# Patient Record
Sex: Male | Born: 1980 | Hispanic: Yes | Marital: Married | Smoking: Current every day smoker
Health system: Southern US, Community
[De-identification: ages and names within clinical notes are randomized; demographics above are authoritative.]

---

## 2007-03-31 ENCOUNTER — Inpatient Hospital Stay: Payer: Self-pay | Admitting: Internal Medicine

## 2011-03-10 ENCOUNTER — Emergency Department: Payer: Self-pay | Admitting: Unknown Physician Specialty

## 2013-03-10 ENCOUNTER — Emergency Department: Payer: Self-pay | Admitting: Emergency Medicine

## 2016-09-20 ENCOUNTER — Emergency Department
Admission: EM | Admit: 2016-09-20 | Discharge: 2016-09-20 | Disposition: A | Discharge status: Home / Self Care | Payer: BLUE CROSS/BLUE SHIELD | Attending: Emergency Medicine | Admitting: Emergency Medicine

## 2016-09-20 ENCOUNTER — Encounter: Payer: Self-pay | Admitting: Emergency Medicine

## 2016-09-20 DIAGNOSIS — F17200 Nicotine dependence, unspecified, uncomplicated: Secondary | ICD-10-CM | POA: Insufficient documentation

## 2016-09-20 DIAGNOSIS — K029 Dental caries, unspecified: Secondary | ICD-10-CM | POA: Insufficient documentation

## 2016-09-20 DIAGNOSIS — K0889 Other specified disorders of teeth and supporting structures: Secondary | ICD-10-CM | POA: Diagnosis present

## 2016-09-20 MED ORDER — TRAMADOL HCL 50 MG PO TABS: 50.0000 mg | ORAL_TABLET | Freq: Four times a day (QID) | ORAL | 0 refills | Status: DC | PRN

## 2016-09-20 MED ORDER — MAGIC MOUTHWASH
5.0000 mL | Freq: Once | ORAL | Status: AC
  Administered 2016-09-20: 5 mL via ORAL
  Filled 2016-09-20: qty 10

## 2016-09-20 MED ORDER — IBUPROFEN 600 MG PO TABS
600.0000 mg | ORAL_TABLET | Freq: Once | ORAL | Status: AC
  Administered 2016-09-20: 600 mg via ORAL
  Filled 2016-09-20: qty 1

## 2016-09-20 MED ORDER — TRAMADOL HCL 50 MG PO TABS
50.0000 mg | ORAL_TABLET | Freq: Once | ORAL | Status: AC
  Administered 2016-09-20: 50 mg via ORAL
  Filled 2016-09-20: qty 1

## 2016-09-20 MED ORDER — CEPHALEXIN 500 MG PO CAPS
500.0000 mg | ORAL_CAPSULE | Freq: Once | ORAL | Status: AC
  Administered 2016-09-20: 500 mg via ORAL
  Filled 2016-09-20: qty 1

## 2016-09-20 MED ORDER — CEPHALEXIN 500 MG PO CAPS: 500.0000 mg | ORAL_CAPSULE | Freq: Four times a day (QID) | ORAL | 0 refills | Status: AC

## 2016-09-20 NOTE — ED Provider Notes (Signed)
[facility] Emergency Department Provider Note   ____________________________________________   First MD Initiated Contact with Patient 09/20/16 0304     (approximate)  I have reviewed the triage vital signs and the nursing notes.   HISTORY  Chief Complaint Dental Pain    HPI Darrell Wang is a 35 y.o. male who comes into the hospital today with some left-sided tooth pain. The patient reports it started 4 days ago but got really bad tonight. The patient has been taking Tylenol but has not helped. The patient does not have a dentist that he has not gone to see his dentist. The patient has numerous cavities. He denies any facial swelling or fever. The patient rates his pain a 10 out of 10 in intensity currently. He is here for evaluation and treatment of the symptoms.   History reviewed. No pertinent past medical history.  There are no active problems to display for this patient.   History reviewed. No pertinent surgical history.  Prior to Admission medications   Medication Sig Start Date End Date Taking? Authorizing Provider  cephALEXin (KEFLEX) 500 MG capsule Take 1 capsule (500 mg total) by mouth 4 (four) times daily. 09/20/16 09/30/16  Rebecka ApleyAllison P Suesan Mohrmann, MD  traMADol (ULTRAM) 50 MG tablet Take 1 tablet (50 mg total) by mouth every 6 (six) hours as needed. 09/20/16   Rebecka ApleyAllison P Chane Cowden, MD    Allergies Patient has no known allergies.  No family history on file.  Social History Social History  Substance Use Topics  . Smoking status: Current Every Day Smoker  . Smokeless tobacco: Not on file  . Alcohol use No    Review of Systems Constitutional: No fever/chills Eyes: No visual changes. ENT: Dental pain Cardiovascular: Denies chest pain. Respiratory: Denies shortness of breath. Gastrointestinal: No abdominal pain.  No nausea, no vomiting.  No diarrhea.  No constipation. Genitourinary: Negative for dysuria. Musculoskeletal: Negative for  back pain. Skin: Negative for rash. Neurological: Negative for headaches, focal weakness or numbness.  10-point ROS otherwise negative.  ____________________________________________   PHYSICAL EXAM:  VITAL SIGNS: ED Triage Vitals  Enc Vitals Group     BP 09/20/16 0145 (!) 164/102     Pulse Rate 09/20/16 0145 72     Resp 09/20/16 0145 18     Temp 09/20/16 0145 98.3 F (36.8 C)     Temp Source 09/20/16 0145 Oral     SpO2 09/20/16 0145 99 %     Weight 09/20/16 0144 190 lb (86.2 kg)     Height 09/20/16 0144 5\' 3"  (1.6 m)     Head Circumference --      Peak Flow --      Pain Score 09/20/16 0143 10     Pain Loc --      Pain Edu? --      Excl. in GC? --     Constitutional: Alert and oriented. Well appearing and in Moderate distress. Eyes: Conjunctivae are normal. PERRL. EOMI. Head: Atraumatic. Nose: No congestion/rhinnorhea. Mouth/Throat: Mucous membranes are moist.  Oropharynx non-erythematous. Poor dentition with numerous dental caries. Pain to the left first mandibular molar Neck: No stridor.   Cardiovascular: Normal rate, regular rhythm. Grossly normal heart sounds.  Good peripheral circulation. Respiratory: Normal respiratory effort.  No retractions. Lungs CTAB. Gastrointestinal: Soft and nontender. No distention. Positive bowel sounds Musculoskeletal: No lower extremity tenderness nor edema.   Neurologic:  Normal speech and language.  Skin:  Skin is warm, dry and intact. Marland Kitchen. Psychiatric:  Mood and affect are normal.   ____________________________________________   LABS (all labs ordered are listed, but only abnormal results are displayed)  Labs Reviewed - No data to display ____________________________________________  EKG  none ____________________________________________  RADIOLOGY  none ____________________________________________   PROCEDURES  Procedure(s) performed: None  Procedures  Critical Care performed:  No  ____________________________________________   INITIAL IMPRESSION / ASSESSMENT AND PLAN / ED COURSE  Pertinent labs & imaging results that were available during my care of the patient were reviewed by me and considered in my medical decision making (see chart for details).  This is a 35 year old male who comes into the hospital today with some dental pain. The patient has poor dentition. I will give him some tramadol and ibuprofen as well as a dose of Keflex. I will encourage the patient to follow-up. He has no swelling to his face nor any difficulty breathing. He should follow-up with a dentist.  The patient stated he had some continued pains I will also give him some Magic mouthwash. He will be discharged home.  Clinical Course      ____________________________________________   FINAL CLINICAL IMPRESSION(S) / ED DIAGNOSES  Final diagnoses:  Dental caries  Pain, dental      NEW MEDICATIONS STARTED DURING THIS VISIT:  Discharge Medication List as of 09/20/2016  3:45 AM    START taking these medications   Details  cephALEXin (KEFLEX) 500 MG capsule Take 1 capsule (500 mg total) by mouth 4 (four) times daily., Starting Wed 09/20/2016, Until Sat 09/30/2016, Print    traMADol (ULTRAM) 50 MG tablet Take 1 tablet (50 mg total) by mouth every 6 (six) hours as needed., Starting Wed 09/20/2016, Print         Note:  This document was prepared using Dragon voice recognition software and may include unintentional dictation errors.    Rebecka ApleyAllison P Latifah Padin, MD 09/20/16 [phone]

## 2016-09-20 NOTE — ED Triage Notes (Signed)
Patient ambulatory to triage with steady gait, without difficulty or distress noted; pt reports left lower dental pain x 4 days

## 2019-04-03 ENCOUNTER — Other Ambulatory Visit: Payer: Self-pay

## 2019-04-03 ENCOUNTER — Emergency Department: Payer: Worker's Compensation

## 2019-04-03 ENCOUNTER — Emergency Department
Admission: EM | Admit: 2019-04-03 | Discharge: 2019-04-03 | Disposition: A | Discharge status: Home / Self Care | Payer: Worker's Compensation | Attending: Emergency Medicine | Admitting: Emergency Medicine

## 2019-04-03 ENCOUNTER — Encounter: Payer: Self-pay | Admitting: Emergency Medicine

## 2019-04-03 DIAGNOSIS — Y990 Civilian activity done for income or pay: Secondary | ICD-10-CM | POA: Diagnosis not present

## 2019-04-03 DIAGNOSIS — Z23 Encounter for immunization: Secondary | ICD-10-CM | POA: Diagnosis not present

## 2019-04-03 DIAGNOSIS — S91332A Puncture wound without foreign body, left foot, initial encounter: Secondary | ICD-10-CM | POA: Diagnosis not present

## 2019-04-03 DIAGNOSIS — Y9301 Activity, walking, marching and hiking: Secondary | ICD-10-CM | POA: Insufficient documentation

## 2019-04-03 DIAGNOSIS — W450XXA Nail entering through skin, initial encounter: Secondary | ICD-10-CM | POA: Insufficient documentation

## 2019-04-03 DIAGNOSIS — F17210 Nicotine dependence, cigarettes, uncomplicated: Secondary | ICD-10-CM | POA: Insufficient documentation

## 2019-04-03 DIAGNOSIS — Y929 Unspecified place or not applicable: Secondary | ICD-10-CM | POA: Diagnosis not present

## 2019-04-03 DIAGNOSIS — S99922A Unspecified injury of left foot, initial encounter: Secondary | ICD-10-CM | POA: Diagnosis present

## 2019-04-03 MED ORDER — IBUPROFEN 600 MG PO TABS: 600.0000 mg | ORAL_TABLET | Freq: Three times a day (TID) | ORAL | 0 refills | Status: AC | PRN

## 2019-04-03 MED ORDER — IBUPROFEN 600 MG PO TABS
600.0000 mg | ORAL_TABLET | Freq: Once | ORAL | Status: AC
  Administered 2019-04-03: 15:00:00 600 mg via ORAL
  Filled 2019-04-03: qty 1

## 2019-04-03 MED ORDER — TETANUS-DIPHTH-ACELL PERTUSSIS 5-2.5-18.5 LF-MCG/0.5 IM SUSP
0.5000 mL | Freq: Once | INTRAMUSCULAR | Status: AC
  Administered 2019-04-03: 0.5 mL via INTRAMUSCULAR
  Filled 2019-04-03: qty 0.5

## 2019-04-03 MED ORDER — CEPHALEXIN 500 MG PO CAPS: 500.0000 mg | ORAL_CAPSULE | Freq: Three times a day (TID) | ORAL | 0 refills | Status: AC

## 2019-04-03 NOTE — ED Provider Notes (Signed)
[facility] Emergency Department Provider Note  ____________________________________________   First MD Initiated Contact with Patient 04/03/19 1417     (approximate)  I have reviewed the triage vital signs and the nursing notes.   HISTORY  Chief Complaint Foot Pain   HPI Darrell Wang is a 38 y.o. male presents to the ED with puncture wound to the plantar aspect of his left foot.  Patient states this occurred at work when he stepped on a nail and it went through his work shoe.  Patient also is unaware of the last time he had a tetanus shot but states "it is been a long time".  Currently rates his pain as a 10/10.     History reviewed. No pertinent past medical history.  There are no active problems to display for this patient.   History reviewed. No pertinent surgical history.  Prior to Admission medications   Medication Sig Start Date End Date Taking? Authorizing Provider  cephALEXin (KEFLEX) 500 MG capsule Take 1 capsule (500 mg total) by mouth 3 (three) times daily. 04/03/19   Tommi Rumps, PA-C  ibuprofen (ADVIL) 600 MG tablet Take 1 tablet (600 mg total) by mouth every 8 (eight) hours as needed. 04/03/19   Tommi Rumps, PA-C    Allergies Patient has no known allergies.  No family history on file.  Social History Social History   Tobacco Use   Smoking status: Current Every Day Smoker    Packs/day: 0.50    Types: Cigarettes   Smokeless tobacco: Never Used  Substance Use Topics   Alcohol use: No   Drug use: Not on file    Review of Systems Constitutional: No fever/chills Cardiovascular: Denies chest pain. Respiratory: Denies shortness of breath. Musculoskeletal: Positive for left foot pain. Skin: Positive for puncture wound. Neurological: Negative for headaches, focal weakness or numbness. ____________________________________________   PHYSICAL EXAM:  VITAL SIGNS: ED Triage Vitals  Enc Vitals Group     BP  04/03/19 1343 140/86     Pulse Rate 04/03/19 1343 87     Resp 04/03/19 1343 16     Temp 04/03/19 1343 98.1 F (36.7 C)     Temp Source 04/03/19 1343 Oral     SpO2 04/03/19 1343 97 %     Weight 04/03/19 1335 200 lb (90.7 kg)     Height 04/03/19 1335 5\' 3"  (1.6 m)     Head Circumference --      Peak Flow --      Pain Score 04/03/19 1334 10     Pain Loc --      Pain Edu? --      Excl. in GC? --    Constitutional: Alert and oriented. Well appearing and in no acute distress. Eyes: Conjunctivae are normal.  Head: Atraumatic. Neck: No stridor.   Cardiovascular: Normal rate, regular rhythm. Grossly normal heart sounds.  Good peripheral circulation. Respiratory: Normal respiratory effort.  No retractions. Lungs CTAB. Musculoskeletal: On examination of the left foot there is no gross deformity however on plantar aspect there is 1 single puncture wound to the lateral aspect of his foot without active bleeding and no foreign body was noted.  Patient is able to move digits distally without any difficulty and motor sensory function intact. Neurologic:  Normal speech and language. No gross focal neurologic deficits are appreciated.  Skin:  Skin is warm, dry and intact.  Single puncture wound as noted above. Psychiatric: Mood and affect are normal. Speech and  behavior are normal.  ____________________________________________   LABS (all labs ordered are listed, but only abnormal results are displayed)  Labs Reviewed - No data to display ____________________________________________  RADIOLOGY  Official radiology report(s): Dg Foot Complete Left  Result Date: 04/03/2019 CLINICAL DATA:  38 year old male stepped on a nail at work. Puncture wound to bottom of foot. EXAM: LEFT FOOT - COMPLETE 3+ VIEW COMPARISON:  None. FINDINGS: No radiopaque foreign body identified. No soft tissue gas. No discrete soft tissue injury. Bone mineralization is within normal limits. There is no evidence of fracture or  dislocation. There is no evidence of arthropathy or other focal bone abnormality. IMPRESSION: No osseous abnormality identified.  No radiopaque foreign body. Electronically Signed   By: Odessa FlemingH  Hall M.D.   On: 04/03/2019 15:13    ____________________________________________   PROCEDURES  Procedure(s) performed (including Critical Care):  Procedures   ____________________________________________   INITIAL IMPRESSION / ASSESSMENT AND PLAN / ED COURSE  As part of my medical decision making, I reviewed the following data within the electronic MEDICAL RECORD NUMBER Notes from prior ED visits and [place] Controlled Substance Database   38 year old male presents to the ED with a puncture wound to his left foot that occurred while he was at work.  X-rays were negative for foreign body or retained product even though patient was made aware that his foot can still get infected and he should watch.  Patient was given a tetanus booster while in the ED.  He was given an ibuprofen along with a prescription for the same.  He was also to start on Keflex 500 mg 3 times daily and do warm soapy soaks.  He is aware that he is to return to the emergency department if any symptoms of worsening, drainage, redness or increased pain.  ____________________________________________   FINAL CLINICAL IMPRESSION(S) / ED DIAGNOSES  Final diagnoses:  Puncture wound of left foot, initial encounter     ED Discharge Orders         Ordered    cephALEXin (KEFLEX) 500 MG capsule  3 times daily     04/03/19 1529    ibuprofen (ADVIL) 600 MG tablet  Every 8 hours PRN     04/03/19 1529           Note:  This document was prepared using Dragon voice recognition software and may include unintentional dictation errors.    Tommi RumpsSummers, Roselle Norton L, PA-C 04/03/19 1543    Sharman CheekStafford, Phillip, MD 04/04/19 1524

## 2019-04-03 NOTE — Discharge Instructions (Addendum)
Follow-up with [facility] acute care or the emergency department if you see any signs of infection or drainage from your foot.  Begin taking antibiotics as directed.  Ibuprofen 3 times daily with food.  A note was written for your work stating that you have limited walking for the next 1 to 2 days.  Also soak your foot in warm soapy water

## 2019-04-03 NOTE — ED Triage Notes (Signed)
Pt here with c/o stepping on a nail at work, punctured bottom of left foot. Pt unsure when he had tetanus shot. NAD. Is workers comp case.

## 2019-11-05 IMAGING — DX LEFT FOOT - COMPLETE 3+ VIEW
3 series · 3 of 3 positions shown · non-contrast
Comparison: None.

CLINICAL DATA: 37-year-old male stepped on a nail at work. Puncture
wound to bottom of foot.

EXAM:
LEFT FOOT - COMPLETE 3+ VIEW

[foot ap]
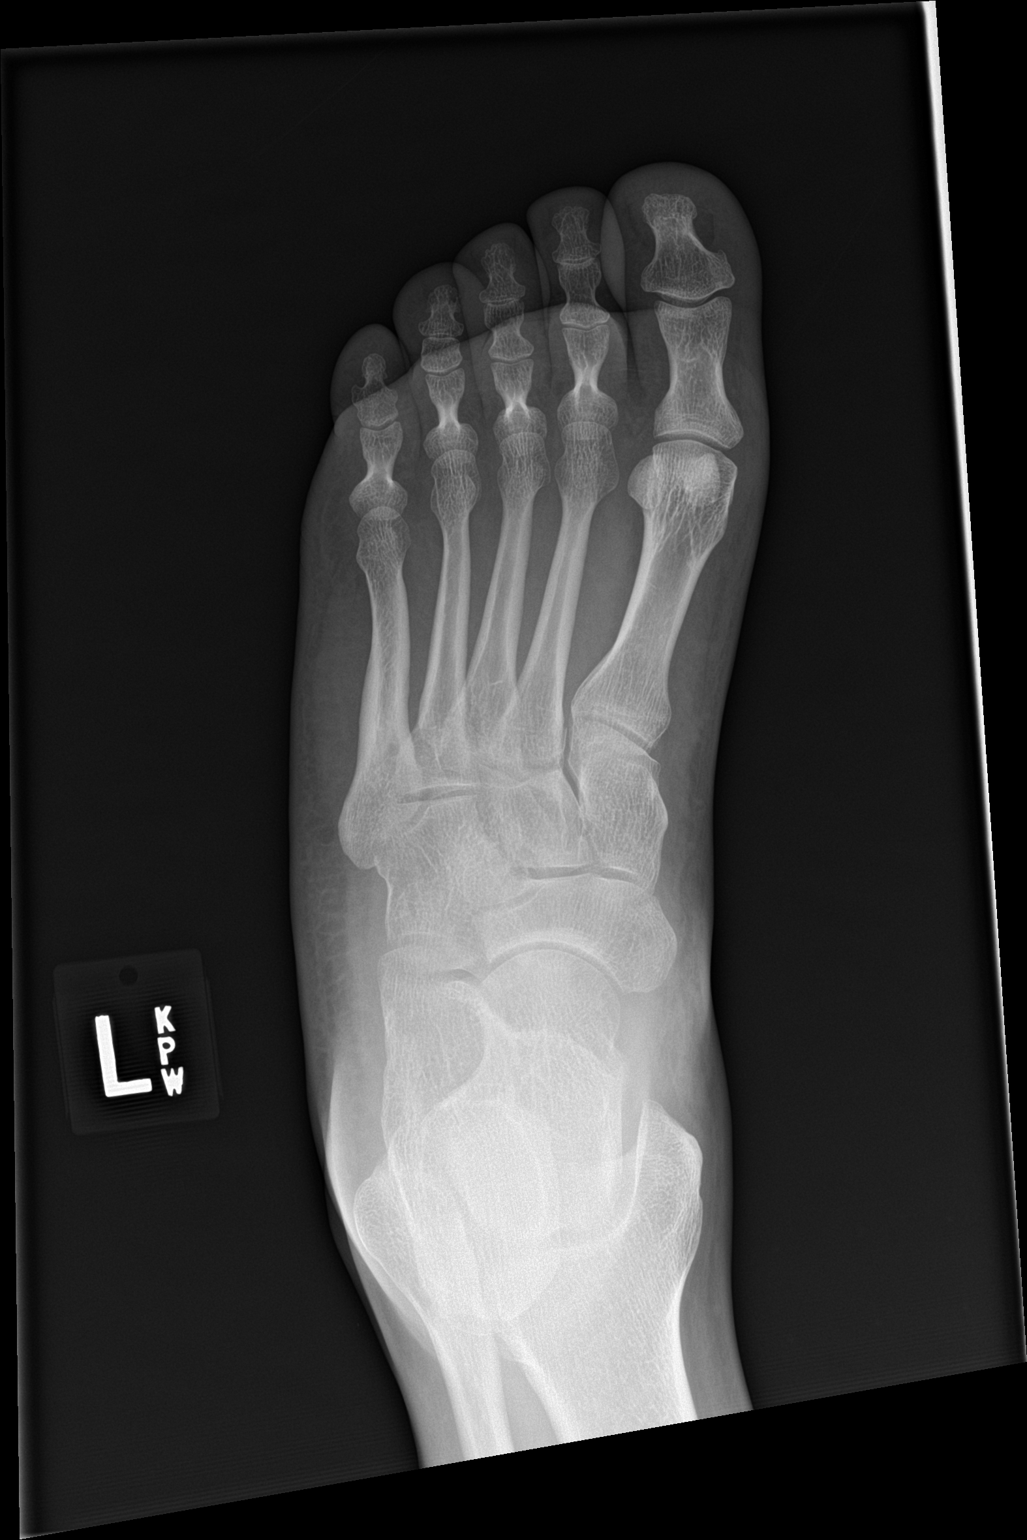

[foot obl]
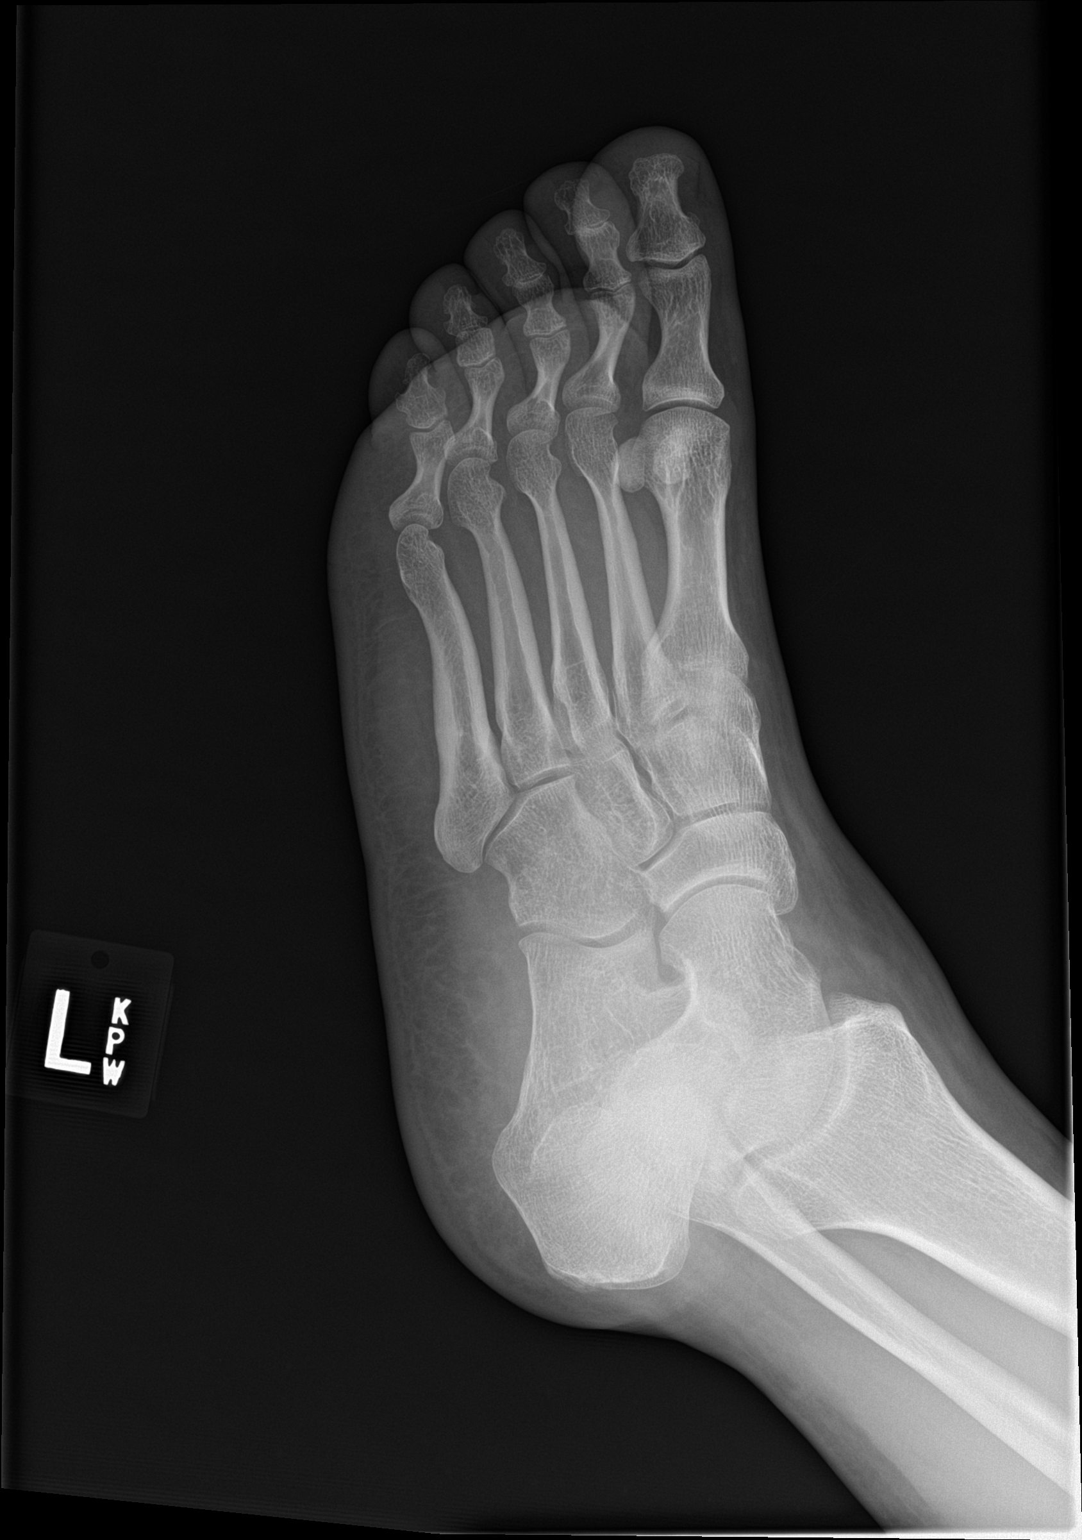

[foot lat]
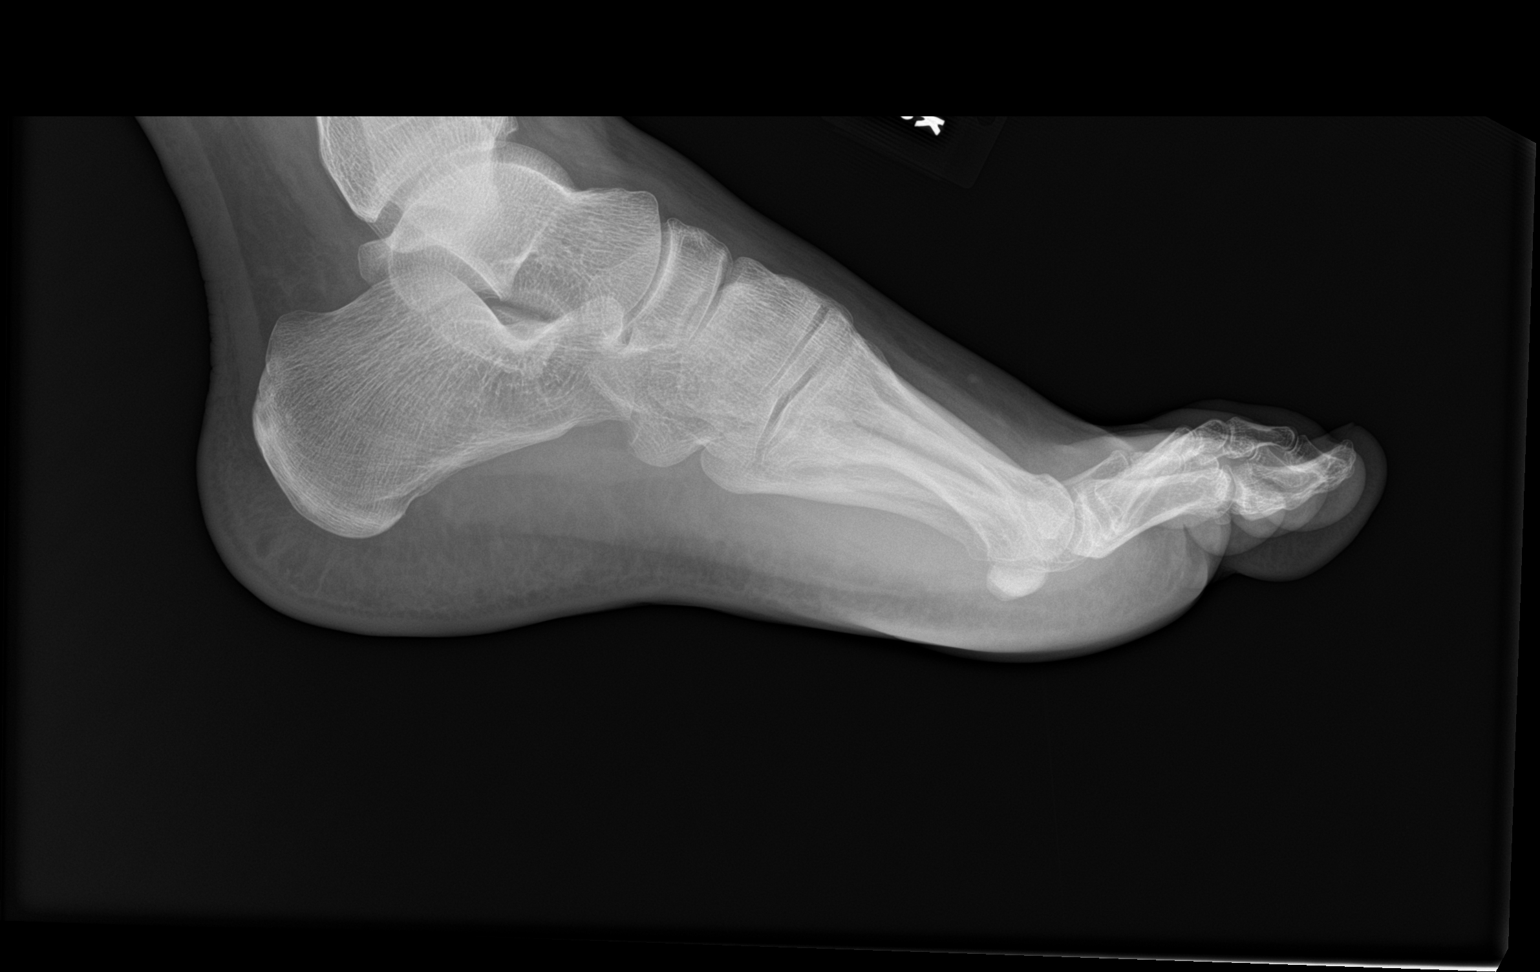

[3 of 3 positions shown; findings below may reference images not displayed]

FINDINGS: No radiopaque foreign body identified. No soft tissue gas. No
discrete soft tissue injury.

Bone mineralization is within normal limits. There is no evidence of
fracture or dislocation. There is no evidence of arthropathy or
other focal bone abnormality.
IMPRESSION: No osseous abnormality identified.  No radiopaque foreign body.
